# Patient Record
Sex: Female | Born: 1960 | Race: White | Hispanic: No | Marital: Married | Smoking: Never smoker
Health system: Southern US, Community
[De-identification: ages and names within clinical notes are randomized; demographics above are authoritative.]

## PROBLEM LIST (undated history)

## (undated) DIAGNOSIS — N390 Urinary tract infection, site not specified: Secondary | ICD-10-CM

## (undated) HISTORY — PX: BLADDER REPAIR: SHX6721

## (undated) HISTORY — PX: EYE SURGERY: SHX253

---

## 2009-05-21 ENCOUNTER — Ambulatory Visit: Payer: Self-pay | Admitting: Obstetrics and Gynecology

## 2009-07-09 ENCOUNTER — Ambulatory Visit: Payer: Self-pay | Admitting: Obstetrics and Gynecology

## 2009-07-13 ENCOUNTER — Ambulatory Visit: Payer: Self-pay | Admitting: Obstetrics and Gynecology

## 2010-06-08 ENCOUNTER — Ambulatory Visit: Payer: Self-pay | Admitting: Obstetrics and Gynecology

## 2011-07-06 ENCOUNTER — Ambulatory Visit: Payer: Self-pay | Admitting: Obstetrics and Gynecology

## 2012-09-12 ENCOUNTER — Ambulatory Visit: Payer: Self-pay | Admitting: Obstetrics and Gynecology

## 2012-12-07 ENCOUNTER — Ambulatory Visit: Payer: Self-pay | Admitting: Gastroenterology

## 2016-02-02 ENCOUNTER — Other Ambulatory Visit: Payer: Self-pay | Admitting: Obstetrics and Gynecology

## 2016-02-02 DIAGNOSIS — Z1231 Encounter for screening mammogram for malignant neoplasm of breast: Secondary | ICD-10-CM

## 2016-02-04 ENCOUNTER — Encounter: Payer: Self-pay | Admitting: Radiology

## 2016-02-04 ENCOUNTER — Ambulatory Visit
Admission: RE | Admit: 2016-02-04 | Discharge: 2016-02-04 | Disposition: A | Discharge status: Home / Self Care | Payer: BLUE CROSS/BLUE SHIELD | Source: Ambulatory Visit | Attending: Obstetrics and Gynecology | Admitting: Obstetrics and Gynecology

## 2016-02-04 DIAGNOSIS — Z1231 Encounter for screening mammogram for malignant neoplasm of breast: Secondary | ICD-10-CM | POA: Diagnosis present

## 2017-02-09 ENCOUNTER — Other Ambulatory Visit: Payer: Self-pay | Admitting: Obstetrics and Gynecology

## 2017-02-09 DIAGNOSIS — Z1239 Encounter for other screening for malignant neoplasm of breast: Secondary | ICD-10-CM

## 2017-03-07 ENCOUNTER — Ambulatory Visit
Admission: RE | Admit: 2017-03-07 | Discharge: 2017-03-07 | Disposition: A | Discharge status: Home / Self Care | Payer: BLUE CROSS/BLUE SHIELD | Source: Ambulatory Visit | Attending: Obstetrics and Gynecology | Admitting: Obstetrics and Gynecology

## 2017-03-07 ENCOUNTER — Encounter: Payer: Self-pay | Admitting: Radiology

## 2017-03-07 DIAGNOSIS — Z1231 Encounter for screening mammogram for malignant neoplasm of breast: Secondary | ICD-10-CM | POA: Insufficient documentation

## 2017-03-07 DIAGNOSIS — Z1239 Encounter for other screening for malignant neoplasm of breast: Secondary | ICD-10-CM

## 2018-02-12 ENCOUNTER — Other Ambulatory Visit: Payer: Self-pay | Admitting: Obstetrics and Gynecology

## 2018-02-12 DIAGNOSIS — Z1231 Encounter for screening mammogram for malignant neoplasm of breast: Secondary | ICD-10-CM

## 2018-03-03 ENCOUNTER — Ambulatory Visit
Admission: EM | Admit: 2018-03-03 | Discharge: 2018-03-03 | Disposition: A | Discharge status: Home / Self Care | Payer: BLUE CROSS/BLUE SHIELD | Attending: Internal Medicine | Admitting: Internal Medicine

## 2018-03-03 ENCOUNTER — Encounter: Payer: Self-pay | Admitting: Gynecology

## 2018-03-03 ENCOUNTER — Other Ambulatory Visit: Payer: Self-pay

## 2018-03-03 DIAGNOSIS — R300 Dysuria: Secondary | ICD-10-CM

## 2018-03-03 DIAGNOSIS — N3091 Cystitis, unspecified with hematuria: Secondary | ICD-10-CM

## 2018-03-03 DIAGNOSIS — R350 Frequency of micturition: Secondary | ICD-10-CM

## 2018-03-03 DIAGNOSIS — R319 Hematuria, unspecified: Secondary | ICD-10-CM | POA: Diagnosis not present

## 2018-03-03 HISTORY — DX: Urinary tract infection, site not specified: N39.0

## 2018-03-03 LAB — URINALYSIS, COMPLETE (UACMP) WITH MICROSCOPIC
Bilirubin Urine: NEGATIVE
Glucose, UA: NEGATIVE mg/dL
Ketones, ur: NEGATIVE mg/dL
Nitrite: NEGATIVE
Protein, ur: 100 mg/dL — AB
RBC / HPF: >50 RBC/hpf (ref 0–5)
Specific Gravity, Urine: 1.015 (ref 1.005–1.030)
Squamous Epithelial / LPF: NONE SEEN (ref 0–5)
WBC, UA: >50 WBC/hpf (ref 0–5)
pH: 7.5 (ref 5.0–8.0)

## 2018-03-03 MED ORDER — CIPROFLOXACIN HCL 500 MG PO TABS: 500.0000 mg | ORAL_TABLET | Freq: Two times a day (BID) | ORAL | 0 refills | Status: DC

## 2018-03-03 NOTE — ED Provider Notes (Addendum)
[facility]    CSN: [id number] Arrival date & time: 03/03/18  6578     History   Chief Complaint Chief Complaint  Patient presents with  . Urinary Tract Infection    HPI Victoria Baker is a 57 y.o. female.   Onset with slight L flank pain 2 days ago, then cloudy urine with mild dysuria. So she doubled up on cramberry supplements in the hopes she would get better. This am had more dysuria and saw blood in urine with some clots. But when she voided when she was here, did not see blood anymore. Similar thing happened to her the first time at which time she was treated with Macrobid. Only had a urine culture the first time, but the second episode she was just called in an antibiotic.  This is the 3rd one in 4 months. Denies abnormal vaginal discharge.      Past Medical History:  Diagnosis Date  . Recurrent UTI     There are no active problems to display for this patient.   Past Surgical History:  Procedure Laterality Date  . BLADDER REPAIR      OB History   None      Home Medications    Prior to Admission medications   Medication Sig Start Date End Date Taking? Authorizing Provider  calcium-vitamin D (OSCAL WITH D) 500-200 MG-UNIT TABS tablet Take by mouth.    [provider]  ciprofloxacin (CIPRO) 500 MG tablet Take 1 tablet (500 mg total) by mouth 2 (two) times daily. 03/03/18   Rodriguez-Southworth, Nettie Elm, PA-C  Misc Natural Products (GLUCOSAMINE-CHONDROITIN SULF PO) Take by mouth.    [provider]  Omega-3 Fatty Acids (RA FISH OIL) 1000 MG CAPS Take by mouth.    [provider]    Family History Family History  Problem Relation Age of Onset  . Breast cancer Maternal Grandmother 80  . Lung cancer Mother     Social History Social History   Tobacco Use  . Smoking status: Never Smoker  . Smokeless tobacco: Never Used  Substance Use Topics  . Alcohol use: Yes  . Drug use: Never     Allergies     Patient has no known allergies.   Review of Systems Review of Systems  Constitutional: Negative for chills and fever.  Gastrointestinal: Positive for abdominal distention.       Mild suprapubic pain  Genitourinary: Positive for dysuria, flank pain, frequency and hematuria. Negative for difficulty urinating, pelvic pain and vaginal discharge.       Has bad hot flushes which are not any worse. Has had mild ache on L flank. Denies past hx of renal stones  Skin: Negative for rash.     Physical Exam Triage Vital Signs ED Triage Vitals  Enc Vitals Group     BP 03/03/18 0824 131/84     Pulse Rate 03/03/18 0824 62     Resp 03/03/18 0824 16     Temp 03/03/18 0824 98.1 F (36.7 C)     Temp Source 03/03/18 0824 Oral     SpO2 03/03/18 0824 100 %     Weight 03/03/18 0819 130 lb (59 kg)     Height 03/03/18 0819 5\' 3"  (1.6 m)     Head Circumference --      Peak Flow --      Pain Score 03/03/18 0819 7     Pain Loc --      Pain Edu? --  Excl. in GC? --    No data found.  Updated Vital Signs BP 131/84 (BP Location: Left Arm)   Pulse 62   Temp 98.1 F (36.7 C) (Oral)   Resp 16   Ht 5\' 3"  (1.6 m)   Wt 130 lb (59 kg)   LMP 01/06/2016 Comment: very sporadic periods-premenopausal  SpO2 100%   BMI 23.03 kg/m   Visual Acuity Right Eye Distance:   Left Eye Distance:   Bilateral Distance:    Right Eye Near:   Left Eye Near:    Bilateral Near:     Physical Exam  Constitutional: She is oriented to person, place, and time. She appears well-developed and well-nourished. No distress.  HENT:  Head: Normocephalic.  Right Ear: External ear normal.  Left Ear: External ear normal.  Eyes: Conjunctivae are normal. No scleral icterus.  Neck: Neck supple.  Pulmonary/Chest: Effort normal.  Abdominal: Soft. Bowel sounds are normal. She exhibits no distension and no mass. There is no tenderness. There is no rebound and no guarding. No hernia.  Has mild L CVA discomfort  Neurological:  She is oriented to person, place, and time.  Skin: Skin is warm and dry. She is not diaphoretic.  Psychiatric: She has a normal mood and affect. Her behavior is normal. Judgment and thought content normal.  Vitals reviewed.    UC Treatments / Results  Labs (all labs ordered are listed, but only abnormal results are displayed) Labs Reviewed  URINALYSIS, COMPLETE (UACMP) WITH MICROSCOPIC - Abnormal; Notable for the following components:      Result Value   Color, Urine STRAW (*)    APPearance CLOUDY (*)    Hgb urine dipstick LARGE (*)    Protein, ur 100 (*)    Leukocytes, UA LARGE (*)    Bacteria, UA RARE (*)    All other components within normal limits    EKG None  Radiology No results found.  Procedures none  Medications Ordered in UC Medications - No data to display  Initial Impression / Assessment and Plan / UC Course  I have reviewed the triage vital signs and the nursing notes.  Pertinent labs results that were available during my care of the patient were reviewed by me and considered in my medical decision making (see chart for details).  I explained to her that Macrobid does not cover pyelo and since this is the 3rd recurrence of UTI and has slight discomfort of L flank area, I will place her on Cipro which will cover this. Pt understands.  She was advised to not exercise while on it due to rare cases of tendon rupture. She agreed.  Clinical Course as of Mar 03 857  Sat Mar 03, 2018  1610 Urinalysis, Complete w Microscopic [SR]  [phone] Urinalysis, Complete w Microscopic [SR]  0851 UA was reviewed.   Bacteria, UA(!): RARE [SR]    Clinical Course User Index [SR] Rodriguez-Southworth, Nettie Elm, PA-C    Final Clinical Impressions(s) / UC Diagnoses   Final diagnoses:  Hemorrhagic cystitis  Urinary frequency  Possible early L pyelo   Discharge Instructions     I will sent your urine for a culture and we should have the results by next day.  Please make sure  you follow-up with your primary care doctor in 10 days so your urine can be rechecked.  Your symptoms should be improving within 48 to 72 hours but if you develop worse back pain fever or more blood in your urine, you  need to go to the emergency room to have further labs and testing.     ED Prescriptions    Medication Sig Dispense Auth. Provider   ciprofloxacin (CIPRO) 500 MG tablet Take 1 tablet (500 mg total) by mouth 2 (two) times daily. 14 tablet Rodriguez-Southworth, Nettie Elm, PA-C     Controlled Substance Prescriptions [place] Controlled Substance Registry consulted? no   Garey Ham, PA-C 03/03/18 0859    Rodriguez-Southworth, Nettie Elm, PA-C 03/03/18 1203

## 2018-03-03 NOTE — Discharge Instructions (Signed)
I will sent your urine for a culture and we should have the results by next day.  Please make sure you follow-up with your primary care doctor in 10 days so your urine can be rechecked.  Your symptoms should be improving within 48 to 72 hours but if you develop worse back pain fever or more blood in your urine, you need to go to the emergency room to have further labs and testing.

## 2018-03-03 NOTE — ED Triage Notes (Signed)
Per patient c/o x couple days with burning and blood in urine.

## 2018-03-08 ENCOUNTER — Ambulatory Visit
Admission: RE | Admit: 2018-03-08 | Discharge: 2018-03-08 | Disposition: A | Discharge status: Home / Self Care | Payer: BLUE CROSS/BLUE SHIELD | Source: Ambulatory Visit | Attending: Obstetrics and Gynecology | Admitting: Obstetrics and Gynecology

## 2018-03-08 DIAGNOSIS — Z1231 Encounter for screening mammogram for malignant neoplasm of breast: Secondary | ICD-10-CM | POA: Insufficient documentation

## 2019-02-15 ENCOUNTER — Other Ambulatory Visit: Payer: Self-pay

## 2019-02-15 ENCOUNTER — Ambulatory Visit
Admission: EM | Admit: 2019-02-15 | Discharge: 2019-02-15 | Disposition: A | Discharge status: Home / Self Care | Payer: BC Managed Care – PPO | Attending: Family Medicine | Admitting: Family Medicine

## 2019-02-15 ENCOUNTER — Encounter: Payer: Self-pay | Admitting: Emergency Medicine

## 2019-02-15 DIAGNOSIS — N390 Urinary tract infection, site not specified: Secondary | ICD-10-CM | POA: Diagnosis not present

## 2019-02-15 DIAGNOSIS — B9689 Other specified bacterial agents as the cause of diseases classified elsewhere: Secondary | ICD-10-CM | POA: Diagnosis not present

## 2019-02-15 DIAGNOSIS — R319 Hematuria, unspecified: Secondary | ICD-10-CM

## 2019-02-15 LAB — URINALYSIS, COMPLETE (UACMP) WITH MICROSCOPIC
Bilirubin Urine: NEGATIVE
Glucose, UA: NEGATIVE mg/dL
Ketones, ur: NEGATIVE mg/dL
Nitrite: NEGATIVE
Protein, ur: 30 mg/dL — AB
Specific Gravity, Urine: 1.015 (ref 1.005–1.030)
WBC, UA: >50 WBC/hpf (ref 0–5)
pH: 7.5 (ref 5.0–8.0)

## 2019-02-15 MED ORDER — CEPHALEXIN 500 MG PO CAPS: 500.0000 mg | ORAL_CAPSULE | Freq: Two times a day (BID) | ORAL | 0 refills | Status: DC

## 2019-02-15 NOTE — ED Triage Notes (Signed)
Patient c/o burning when urinating and urinary frequency that started yesterday.  Patient reports noticing some blood in her urine this morning.

## 2019-02-15 NOTE — ED Provider Notes (Signed)
[facility]    CSN: [id number] Arrival date & time: 02/15/19  1014      History   Chief Complaint Chief Complaint  Patient presents with  . Dysuria    HPI Victoria Baker is a 58 y.o. female.   58 yo female with a c/o burning with urination and urinary frequency since yesterday. Denies any fevers, chills, vomiting, states she noticed a little bit of blood in the urine today.    Dysuria   Past Medical History:  Diagnosis Date  . Recurrent UTI     There are no active problems to display for this patient.   Past Surgical History:  Procedure Laterality Date  . BLADDER REPAIR      OB History   No obstetric history on file.      Home Medications    Prior to Admission medications   Medication Sig Start Date End Date Taking? Authorizing Provider  calcium-vitamin D (OSCAL WITH D) 500-200 MG-UNIT TABS tablet Take by mouth.   Yes [provider]  Misc Natural Products (GLUCOSAMINE-CHONDROITIN SULF PO) Take by mouth.   Yes [provider]  Omega-3 Fatty Acids (RA FISH OIL) 1000 MG CAPS Take by mouth.   Yes [provider]  cephALEXin (KEFLEX) 500 MG capsule Take 1 capsule (500 mg total) by mouth 2 (two) times daily. 02/15/19   Payton Mccallum, MD  ciprofloxacin (CIPRO) 500 MG tablet Take 1 tablet (500 mg total) by mouth 2 (two) times daily. 03/03/18   Rodriguez-Southworth, Nettie Elm, PA-C    Family History Family History  Problem Relation Age of Onset  . Breast cancer Maternal Grandmother 80  . Lung cancer Mother     Social History Social History   Tobacco Use  . Smoking status: Never Smoker  . Smokeless tobacco: Never Used  Substance Use Topics  . Alcohol use: Yes  . Drug use: Never     Allergies   Patient has no known allergies.   Review of Systems Review of Systems  Genitourinary: Positive for dysuria.     Physical Exam Triage Vital Signs ED Triage Vitals [02/15/19 1020]  Enc Vitals Group     BP  140/87     Pulse Rate 62     Resp 14     Temp 97.6 F (36.4 C)     Temp Source Oral     SpO2 100 %     Weight 130 lb (59 kg)     Height 5\' 3"  (1.6 m)     Head Circumference      Peak Flow      Pain Score 7     Pain Loc      Pain Edu?      Excl. in GC?    No data found.  Updated Vital Signs BP 140/87 (BP Location: Left Arm)   Pulse 62   Temp 97.6 F (36.4 C) (Oral)   Resp 14   Ht 5\' 3"  (1.6 m)   Wt 59 kg   LMP 01/06/2016   SpO2 100%   BMI 23.03 kg/m   Visual Acuity Right Eye Distance:   Left Eye Distance:   Bilateral Distance:    Right Eye Near:   Left Eye Near:    Bilateral Near:     Physical Exam Vitals signs and nursing note reviewed.  Constitutional:      General: She is not in acute distress.    Appearance: She is not toxic-appearing or diaphoretic.  Abdominal:  Palpations: Abdomen is soft.     Tenderness: There is no abdominal tenderness.  Neurological:     Mental Status: She is alert.      UC Treatments / Results  Labs (all labs ordered are listed, but only abnormal results are displayed) Labs Reviewed  URINALYSIS, COMPLETE (UACMP) WITH MICROSCOPIC - Abnormal; Notable for the following components:      Result Value   Hgb urine dipstick LARGE (*)    Protein, ur 30 (*)    Leukocytes,Ua SMALL (*)    Bacteria, UA RARE (*)    All other components within normal limits  URINE CULTURE    EKG   Radiology No results found.  Procedures Procedures (including critical care time)  Medications Ordered in UC Medications - No data to display  Initial Impression / Assessment and Plan / UC Course  I have reviewed the triage vital signs and the nursing notes.  Pertinent labs & imaging results that were available during my care of the patient were reviewed by me and considered in my medical decision making (see chart for details).     Final Clinical Impressions(s) / UC Diagnoses   Final diagnoses:  Urinary tract infection with hematuria,  site unspecified     Discharge Instructions     Increase water intake    ED Prescriptions    Medication Sig Dispense Auth. Provider   cephALEXin (KEFLEX) 500 MG capsule Take 1 capsule (500 mg total) by mouth 2 (two) times daily. 14 capsule Norval Gable, MD     1. Lab results and diagnosis reviewed with patient 2. rx as per orders above; reviewed possible side effects, interactions, risks and benefits  3. Recommend supportive treatment as above 4. Follow-up prn if symptoms worsen or don't improve  PDMP not reviewed this encounter.   Norval Gable, MD 02/15/19 1435

## 2019-02-15 NOTE — Discharge Instructions (Addendum)
Increase water intake

## 2019-02-16 LAB — URINE CULTURE
Culture: <10000 — AB
Special Requests: NORMAL

## 2020-02-11 ENCOUNTER — Other Ambulatory Visit: Payer: Self-pay | Admitting: Family

## 2020-02-11 DIAGNOSIS — Z1231 Encounter for screening mammogram for malignant neoplasm of breast: Secondary | ICD-10-CM

## 2020-06-03 ENCOUNTER — Ambulatory Visit
Admission: RE | Admit: 2020-06-03 | Discharge: 2020-06-03 | Disposition: A | Discharge status: Home / Self Care | Payer: BC Managed Care – PPO | Source: Ambulatory Visit | Attending: Family | Admitting: Family

## 2020-06-03 ENCOUNTER — Other Ambulatory Visit: Payer: Self-pay

## 2020-06-03 DIAGNOSIS — Z1231 Encounter for screening mammogram for malignant neoplasm of breast: Secondary | ICD-10-CM | POA: Insufficient documentation

## 2022-01-14 ENCOUNTER — Other Ambulatory Visit: Payer: Self-pay | Admitting: Family

## 2022-01-14 DIAGNOSIS — Z1231 Encounter for screening mammogram for malignant neoplasm of breast: Secondary | ICD-10-CM

## 2022-01-31 LAB — COLOGUARD: COLOGUARD: NEGATIVE

## 2022-01-31 LAB — EXTERNAL GENERIC LAB PROCEDURE: COLOGUARD: NEGATIVE

## 2022-02-03 ENCOUNTER — Ambulatory Visit
Admission: RE | Admit: 2022-02-03 | Discharge: 2022-02-03 | Disposition: A | Discharge status: Home / Self Care | Payer: BC Managed Care – PPO | Source: Ambulatory Visit | Attending: Family | Admitting: Family

## 2022-02-03 DIAGNOSIS — Z1231 Encounter for screening mammogram for malignant neoplasm of breast: Secondary | ICD-10-CM | POA: Insufficient documentation

## 2022-05-21 IMAGING — MG DIGITAL SCREENING BILAT W/ TOMO W/ CAD
8 series · 9 of 24 positions shown · non-contrast
Comparison: Previous exam(s).

CLINICAL DATA: Screening.

EXAM:
DIGITAL SCREENING BILATERAL MAMMOGRAM WITH TOMO AND CAD

[R CC synth-2D]
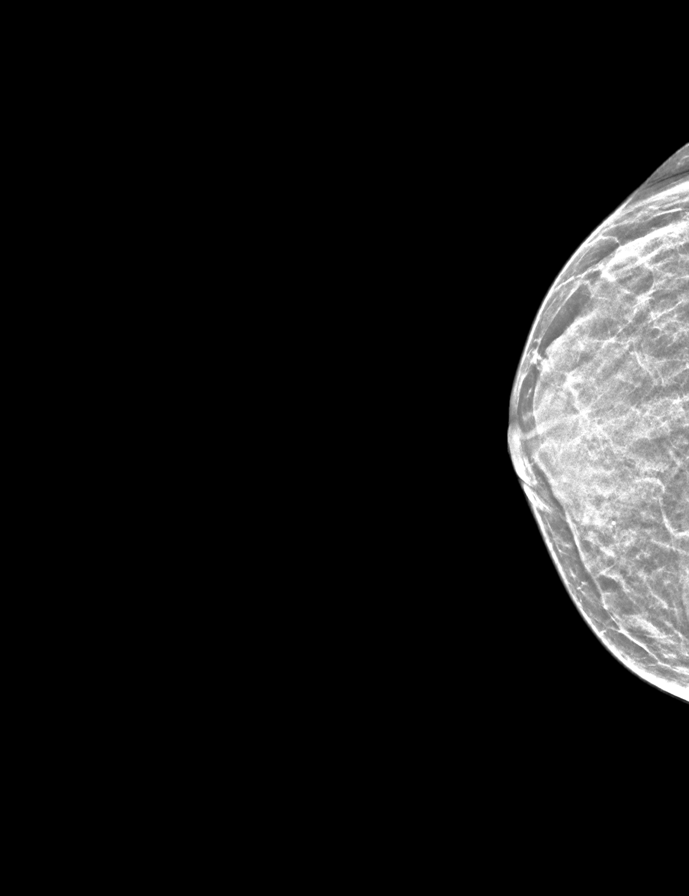

[L MLO synth-2D]
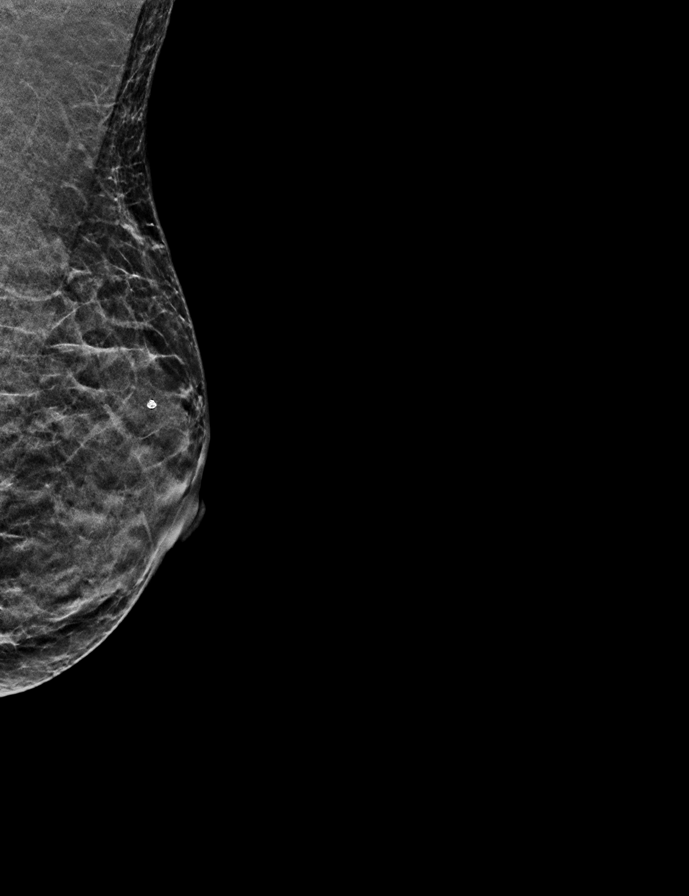

[L CC synth-2D]
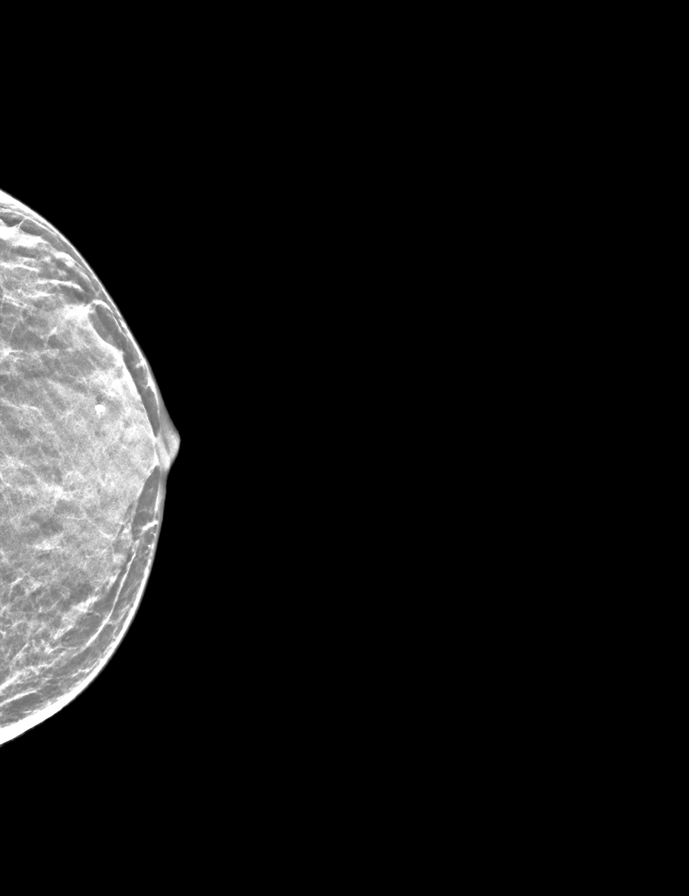

[R MLO synth-2D]
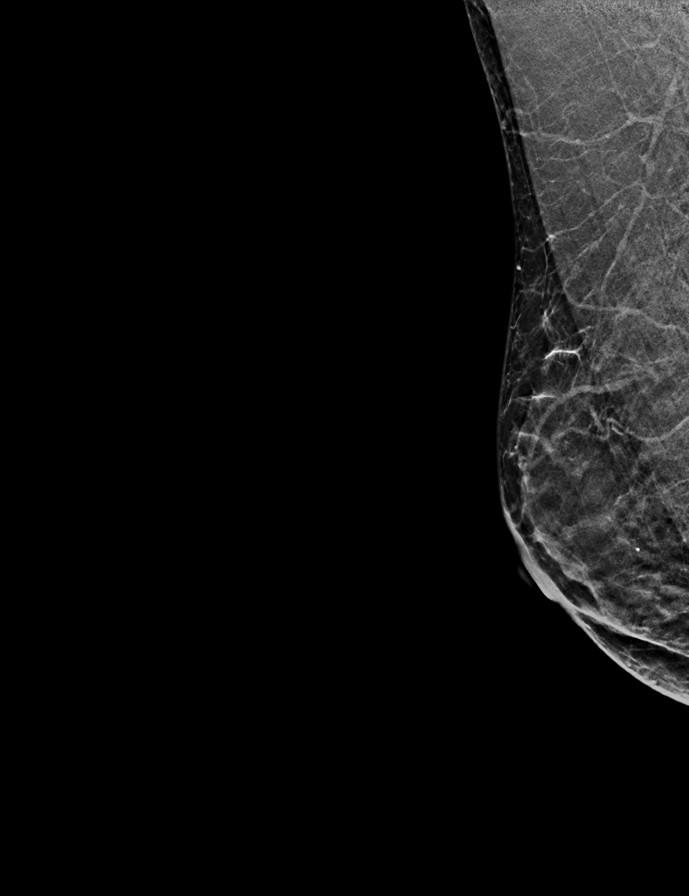

[L CC tomo · 2 of 29 frames shown]
[frame 10/29]
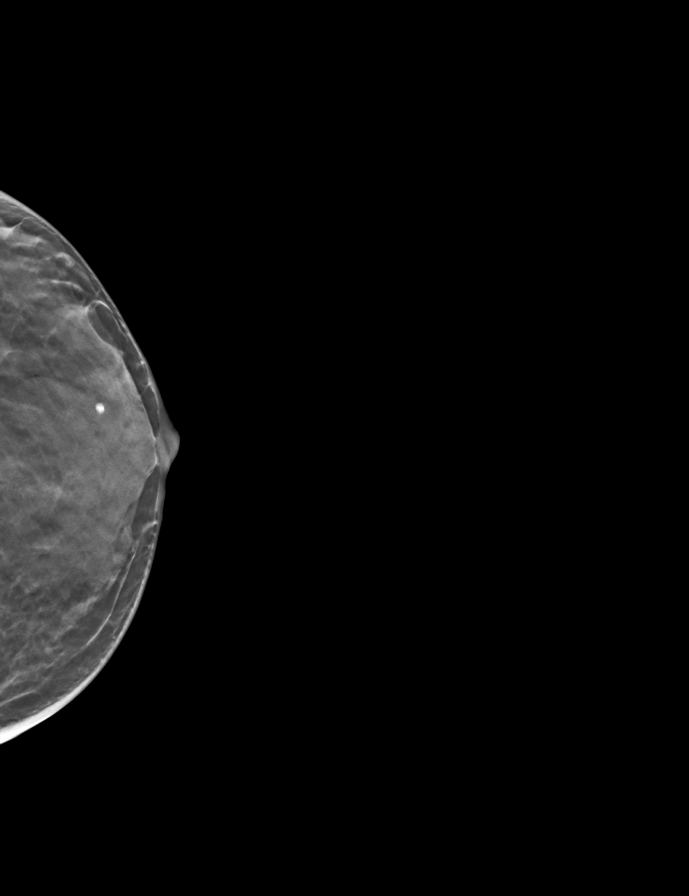
[frame 15/29]
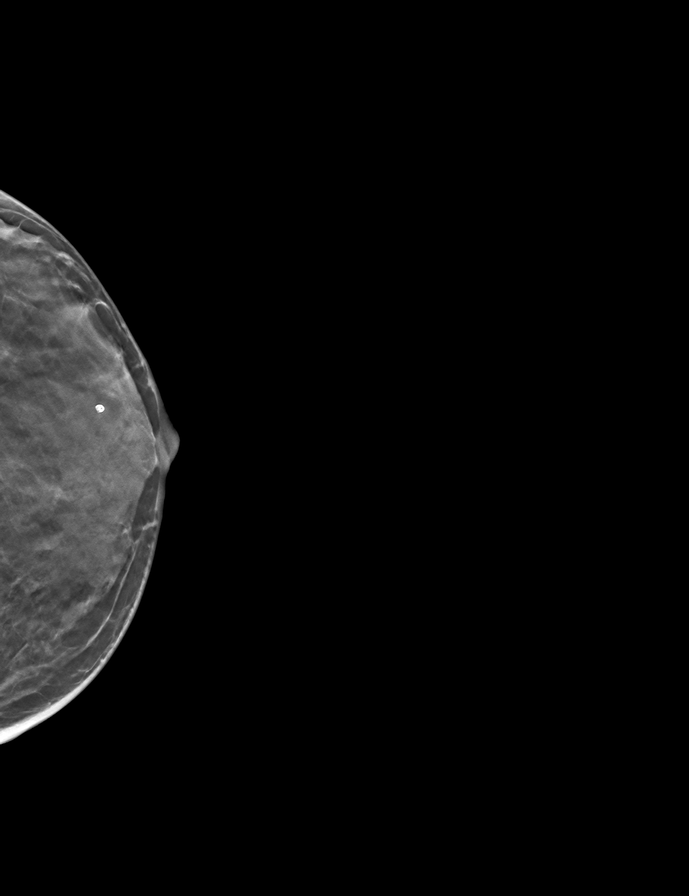

[L MLO tomo · tomo slice 15/29.0]
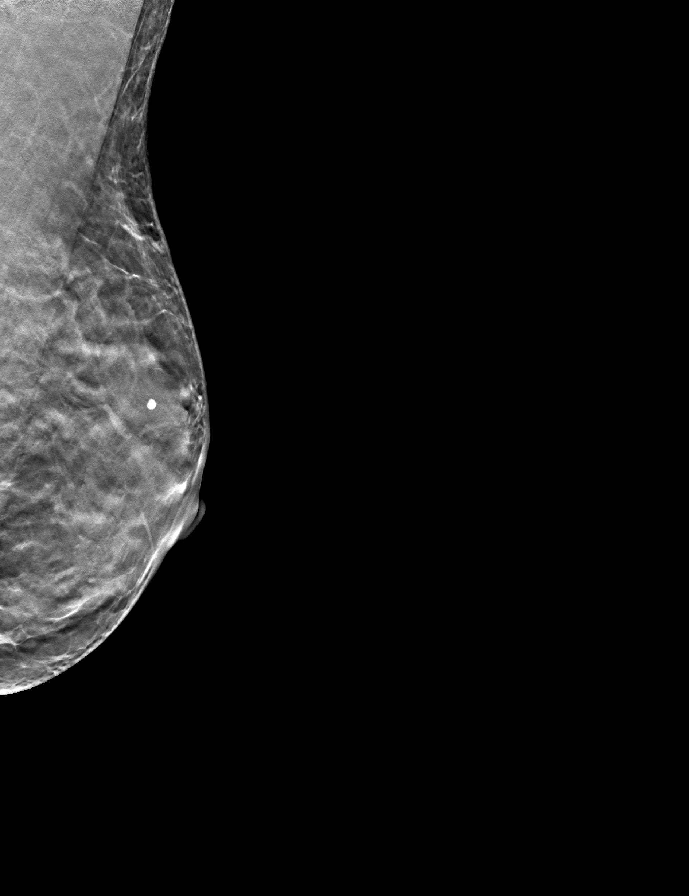

[R MLO tomo · tomo slice 15/30.0]
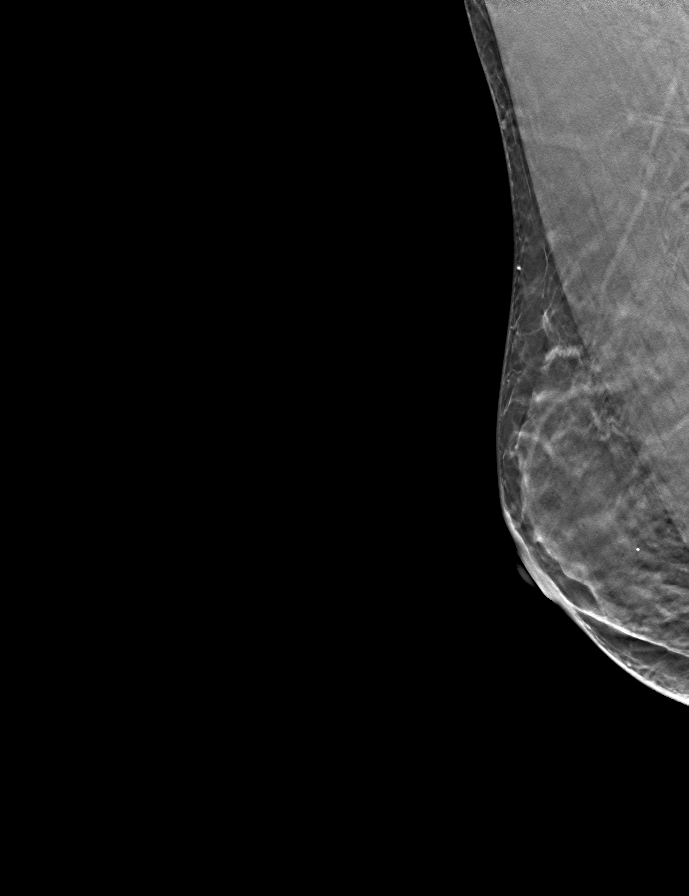

[R CC tomo · tomo slice 15/29.0]
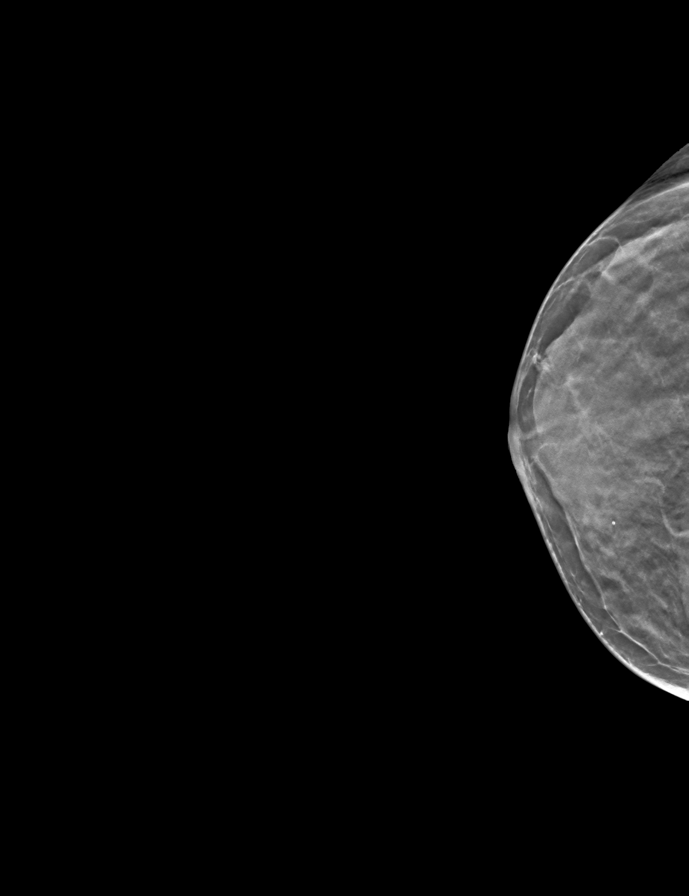

[9 of 24 positions shown; findings below may reference images not displayed]

ACR Breast Density Category d: The breast tissue is extremely dense,
which lowers the sensitivity of mammography
FINDINGS: There are no findings suspicious for malignancy. Images were
processed with CAD.
IMPRESSION: No mammographic evidence of malignancy. A result letter of this
screening mammogram will be mailed directly to the patient.

RECOMMENDATION:
Screening mammogram in one year. (Code:WO-0-ZI0)

BI-RADS CATEGORY  1: Negative.

## 2023-08-06 ENCOUNTER — Encounter: Payer: Self-pay | Admitting: Emergency Medicine

## 2023-08-06 ENCOUNTER — Ambulatory Visit
Admission: EM | Admit: 2023-08-06 | Discharge: 2023-08-06 | Disposition: A | Discharge status: Home / Self Care | Payer: Self-pay | Attending: Emergency Medicine | Admitting: Emergency Medicine

## 2023-08-06 DIAGNOSIS — B3731 Acute candidiasis of vulva and vagina: Secondary | ICD-10-CM

## 2023-08-06 DIAGNOSIS — N390 Urinary tract infection, site not specified: Secondary | ICD-10-CM | POA: Diagnosis present

## 2023-08-06 LAB — URINALYSIS, W/ REFLEX TO CULTURE (INFECTION SUSPECTED)
Bilirubin Urine: NEGATIVE
Glucose, UA: NEGATIVE mg/dL
Ketones, ur: NEGATIVE mg/dL
Nitrite: NEGATIVE
Protein, ur: 100 mg/dL — AB
RBC / HPF: >50 RBC/hpf (ref 0–5)
Specific Gravity, Urine: 1.015 (ref 1.005–1.030)
WBC, UA: >50 WBC/hpf (ref 0–5)
pH: 6 (ref 5.0–8.0)

## 2023-08-06 MED ORDER — PHENAZOPYRIDINE HCL 200 MG PO TABS
200.0000 mg | ORAL_TABLET | Freq: Three times a day (TID) | ORAL | 0 refills | Status: AC
Start: 2023-08-06 — End: ?

## 2023-08-06 MED ORDER — NITROFURANTOIN MONOHYD MACRO 100 MG PO CAPS: 100.0000 mg | ORAL_CAPSULE | Freq: Two times a day (BID) | ORAL | 0 refills | Status: AC

## 2023-08-06 MED ORDER — FLUCONAZOLE 150 MG PO TABS: 150.0000 mg | ORAL_TABLET | ORAL | 0 refills | Status: AC

## 2023-08-06 NOTE — ED Provider Notes (Signed)
 [facility]    CSN: [id number] Arrival date & time: 08/06/23  0946      History   Chief Complaint Chief Complaint  Patient presents with   Hematuria   Urinary Frequency    HPI Victoria Baker is a 63 y.o. female.   HPI  63 year old female with past medical history significant for recurrent UTIs presents for evaluation of painful urination and urinary frequency that started 2 days ago and then she developed hematuria this morning.  She denies any fever, abdominal pain, or vomiting.  She does endorse mild nausea.  Past Medical History:  Diagnosis Date   Recurrent UTI     There are no active problems to display for this patient.   Past Surgical History:  Procedure Laterality Date   BLADDER REPAIR     EYE SURGERY      OB History   No obstetric history on file.      Home Medications    Prior to Admission medications   Medication Sig Start Date End Date Taking? Authorizing Provider  fluconazole (DIFLUCAN) 150 MG tablet Take 1 tablet (150 mg total) by mouth every 3 (three) days for 3 doses. 08/06/23 08/13/23 Yes Becky Augusta, NP  nitrofurantoin, macrocrystal-monohydrate, (MACROBID) 100 MG capsule Take 1 capsule (100 mg total) by mouth 2 (two) times daily. 08/06/23  Yes Becky Augusta, NP  phenazopyridine (PYRIDIUM) 200 MG tablet Take 1 tablet (200 mg total) by mouth 3 (three) times daily. 08/06/23  Yes Becky Augusta, NP  calcium-vitamin D (OSCAL WITH D) 500-200 MG-UNIT TABS tablet Take by mouth.    [provider]  Misc Natural Products (GLUCOSAMINE-CHONDROITIN SULF PO) Take by mouth.    [provider]  Omega-3 Fatty Acids (RA FISH OIL) 1000 MG CAPS Take by mouth.    [provider]    Family History Family History  Problem Relation Age of Onset   Breast cancer Maternal Grandmother 94   Lung cancer Mother     Social History Social History   Tobacco Use   Smoking status: Never   Smokeless tobacco: Never  Vaping Use    Vaping status: Never Used  Substance Use Topics   Alcohol use: Yes   Drug use: Never     Allergies   Patient has no known allergies.   Review of Systems Review of Systems  Constitutional:  Negative for fever.  Gastrointestinal:  Positive for nausea. Negative for abdominal pain and vomiting.  Genitourinary:  Positive for dysuria, frequency, hematuria and urgency.  Musculoskeletal:  Positive for back pain.     Physical Exam Triage Vital Signs ED Triage Vitals  Encounter Vitals Group     BP 08/06/23 1037 111/72     Systolic BP Percentile --      Diastolic BP Percentile --      Pulse Rate 08/06/23 1037 64     Resp 08/06/23 1037 14     Temp 08/06/23 1037 98.2 F (36.8 C)     Temp Source 08/06/23 1037 Oral     SpO2 08/06/23 1037 100 %     Weight 08/06/23 1036 125 lb (56.7 kg)     Height 08/06/23 1036 5\' 3"  (1.6 m)     Head Circumference --      Peak Flow --      Pain Score 08/06/23 1036 4     Pain Loc --      Pain Education --      Exclude from Growth Chart --  No data found.  Updated Vital Signs BP 111/72 (BP Location: Left Arm)   Pulse 64   Temp 98.2 F (36.8 C) (Oral)   Resp 14   Ht 5\' 3"  (1.6 m)   Wt 125 lb (56.7 kg)   LMP 01/06/2016   SpO2 100%   BMI 22.14 kg/m   Visual Acuity Right Eye Distance:   Left Eye Distance:   Bilateral Distance:    Right Eye Near:   Left Eye Near:    Bilateral Near:     Physical Exam Vitals and nursing note reviewed.  Constitutional:      Appearance: Normal appearance.  HENT:     Head: Normocephalic and atraumatic.  Cardiovascular:     Rate and Rhythm: Normal rate and regular rhythm.     Pulses: Normal pulses.     Heart sounds: Normal heart sounds. No murmur heard.    No friction rub. No gallop.  Pulmonary:     Effort: Pulmonary effort is normal.     Breath sounds: Normal breath sounds. No wheezing, rhonchi or rales.  Abdominal:     Tenderness: There is no right CVA tenderness or left CVA tenderness.   Skin:    General: Skin is warm and dry.     Capillary Refill: Capillary refill takes less than 2 seconds.     Findings: No rash.  Neurological:     General: No focal deficit present.     Mental Status: She is alert and oriented to person, place, and time.      UC Treatments / Results  Labs (all labs ordered are listed, but only abnormal results are displayed) Labs Reviewed  URINALYSIS, W/ REFLEX TO CULTURE (INFECTION SUSPECTED) - Abnormal; Notable for the following components:      Result Value   Color, Urine AMBER (*)    APPearance CLOUDY (*)    Hgb urine dipstick LARGE (*)    Protein, ur 100 (*)    Leukocytes,Ua MODERATE (*)    Bacteria, UA FEW (*)    All other components within normal limits  URINE CULTURE    EKG   Radiology No results found.  Procedures Procedures (including critical care time)  Medications Ordered in UC Medications - No data to display  Initial Impression / Assessment and Plan / UC Course  I have reviewed the triage vital signs and the nursing notes.  Pertinent labs & imaging results that were available during my care of the patient were reviewed by me and considered in my medical decision making (see chart for details).   Patient is a pleasant, nontoxic-appearing 63 year old female presenting for evaluation of UTI symptoms as outlined HPI above.  In the exam room patient is not in any acute distress.  She does endorse some mild low back pain but she attributes that to working out in the yard.  She has had not had any abdominal pain or vomiting though she has had mild nausea.  Physical exam reveals a benign cardiopulmonary exam.  No CVA tenderness present on exam.  I will order urinalysis to evaluate for presence of UTI.  Urinalysis shows amber color with cloudy appearance, large hemoglobin, 100 protein, moderate leukocyte esterase but negative for nitrites or ketones.  Also no glucose.  Reflex microscopy shows greater than 50 WBCs, greater than  50 RBCs, few bacteria, WBC clumps, and budding yeast.  Urine will reflex to culture.  I will discharge patient on the diagnosis of urinary tract infection and start her on Macrobid  100 mg twice daily with food for 5 days for treatment for her UTI.  Also Pyridium every 8 hours as needed for urinary discomfort.  With regards to the yeast present in the urine I will start her on Diflucan 150 mg tablets, 1 tablet now and repeat dosing every 3 days for total of 3 doses.   Final Clinical Impressions(s) / UC Diagnoses   Final diagnoses:  Lower urinary tract infectious disease  Vaginal yeast infection     Discharge Instructions      Take the Macrobid twice daily for 5 days with food for treatment of urinary tract infection.  Use the Pyridium every 8 hours as needed for urinary discomfort.  This will turn your urine a bright red-orange.  Increase your oral fluid intake so that you increase your urine production and or flushing your urinary system.  Take an over-the-counter probiotic, such as Culturelle-Align-Activia, 1 hour after each dose of antibiotic to prevent diarrhea or yeast infections from forming.  We will culture urine and change the antibiotics if necessary.  Your urinalysis also showed budding yeast, which is most likely coming from her vaginal vault.  I had prescribed you Diflucan for the treatment of a vaginal yeast infection.  Take 1 tablet now and repeat dosing every 3 days for total of 3 doses.  Return for reevaluation, or see your primary care provider, for any new or worsening symptoms.      ED Prescriptions     Medication Sig Dispense Auth. Provider   nitrofurantoin, macrocrystal-monohydrate, (MACROBID) 100 MG capsule Take 1 capsule (100 mg total) by mouth 2 (two) times daily. 10 capsule Becky Augusta, NP   phenazopyridine (PYRIDIUM) 200 MG tablet Take 1 tablet (200 mg total) by mouth 3 (three) times daily. 6 tablet Becky Augusta, NP   fluconazole (DIFLUCAN) 150 MG  tablet Take 1 tablet (150 mg total) by mouth every 3 (three) days for 3 doses. 3 tablet Becky Augusta, NP      PDMP not reviewed this encounter.   Becky Augusta, NP 08/06/23 1108

## 2023-08-06 NOTE — ED Triage Notes (Signed)
 Patient c/o dysuria and urinary frequency that started couple days ago.  Patient reports some blood in her urine this morning.  Patient denies fevers.

## 2023-08-06 NOTE — Discharge Instructions (Addendum)
 Take the Macrobid twice daily for 5 days with food for treatment of urinary tract infection.  Use the Pyridium every 8 hours as needed for urinary discomfort.  This will turn your urine a bright red-orange.  Increase your oral fluid intake so that you increase your urine production and or flushing your urinary system.  Take an over-the-counter probiotic, such as Culturelle-Align-Activia, 1 hour after each dose of antibiotic to prevent diarrhea or yeast infections from forming.  We will culture urine and change the antibiotics if necessary.  Your urinalysis also showed budding yeast, which is most likely coming from her vaginal vault.  I had prescribed you Diflucan for the treatment of a vaginal yeast infection.  Take 1 tablet now and repeat dosing every 3 days for total of 3 doses.  Return for reevaluation, or see your primary care provider, for any new or worsening symptoms.

## 2023-08-08 LAB — URINE CULTURE: Culture: 80000 — AB
